# Patient Record
Sex: Male | Born: 1975 | Race: White | Hispanic: No | Marital: Married | Smoking: Never smoker
Health system: Southern US, Community
[De-identification: ages and names within clinical notes are randomized; demographics above are authoritative.]

## PROBLEM LIST (undated history)

## (undated) HISTORY — PX: UPPER GI ENDOSCOPY: SHX6162

## (undated) HISTORY — PX: DENTAL SURGERY: SHX609

---

## 1998-11-30 ENCOUNTER — Ambulatory Visit (HOSPITAL_BASED_OUTPATIENT_CLINIC_OR_DEPARTMENT_OTHER): Admission: RE | Admit: 1998-11-30 | Discharge: 1998-11-30 | Payer: Self-pay | Admitting: Otolaryngology

## 2009-12-08 ENCOUNTER — Emergency Department (HOSPITAL_BASED_OUTPATIENT_CLINIC_OR_DEPARTMENT_OTHER): Admission: EM | Admit: 2009-12-08 | Discharge: 2009-12-08 | Payer: Self-pay | Admitting: Emergency Medicine

## 2009-12-09 ENCOUNTER — Emergency Department (HOSPITAL_BASED_OUTPATIENT_CLINIC_OR_DEPARTMENT_OTHER): Admission: EM | Admit: 2009-12-09 | Discharge: 2009-12-09 | Payer: Self-pay | Admitting: Emergency Medicine

## 2011-09-14 ENCOUNTER — Ambulatory Visit: Payer: Self-pay

## 2013-06-05 ENCOUNTER — Emergency Department (HOSPITAL_COMMUNITY): Payer: BC Managed Care – PPO

## 2013-06-05 ENCOUNTER — Encounter (HOSPITAL_COMMUNITY): Payer: Self-pay

## 2013-06-05 ENCOUNTER — Emergency Department (HOSPITAL_COMMUNITY)
Admission: EM | Admit: 2013-06-05 | Discharge: 2013-06-05 | Disposition: A | Discharge status: Home / Self Care | Payer: BC Managed Care – PPO | Attending: Emergency Medicine | Admitting: Emergency Medicine

## 2013-06-05 DIAGNOSIS — R1013 Epigastric pain: Secondary | ICD-10-CM | POA: Insufficient documentation

## 2013-06-05 DIAGNOSIS — R109 Unspecified abdominal pain: Secondary | ICD-10-CM

## 2013-06-05 LAB — LIPASE, BLOOD: Lipase: 44 U/L (ref 11–59)

## 2013-06-05 LAB — COMPREHENSIVE METABOLIC PANEL
ALT: 28 U/L (ref 0–53)
AST: 25 U/L (ref 0–37)
Albumin: 4.3 g/dL (ref 3.5–5.2)
Alkaline Phosphatase: 50 U/L (ref 39–117)
Potassium: 3.9 mEq/L (ref 3.5–5.1)
Sodium: 142 mEq/L (ref 135–145)
Total Protein: 7.4 g/dL (ref 6.0–8.3)

## 2013-06-05 LAB — CBC
MCHC: 35.4 g/dL (ref 30.0–36.0)
Platelets: 174 10*3/uL (ref 150–400)
RDW: 13.2 % (ref 11.5–15.5)
WBC: 6 10*3/uL (ref 4.0–10.5)

## 2013-06-05 NOTE — ED Notes (Signed)
Pt escorted to discharge window. Pt verbalized understanding discharge instructions. In no acute distress.  

## 2013-06-05 NOTE — ED Provider Notes (Signed)
TIME SEEN: 10:30 AM  CHIEF COMPLAINT: Abdominal pain  HPI: Patient is a 37 year old male with no significant past medical history who presents the emergency department for epigastric abdominal pain that has been present intermittently since his endoscopy on Wednesday, 4 days ago. He reports that after he eats he'll have 1-2 minutes of sharp abdominal pain in his epigastric region that is mild to moderate in nature without radiation. It will resolve spontaneously. No known alleviating factors. He has never had similar symptoms. Denies any chest pain or shortness of breath. No vomiting or diarrhea. No bloody stool or melena. No fever. Patient called his gastroenterologist instructed him to come the emergency department for evaluation.  ROS: See HPI Constitutional: no fever  Eyes: no drainage  ENT: no runny nose   Cardiovascular:  no chest pain  Resp: no SOB  GI: no vomiting GU: no dysuria Integumentary: no rash  Allergy: no hives  Musculoskeletal: no leg swelling  Neurological: no slurred speech ROS otherwise negative  PAST MEDICAL HISTORY/PAST SURGICAL HISTORY:  History reviewed. No pertinent past medical history.  MEDICATIONS:  Prior to Admission medications   Not on File    ALLERGIES:  Allergies not on file  SOCIAL HISTORY:  History  Substance Use Topics  . Smoking status: Never Smoker   . Smokeless tobacco: Never Used  . Alcohol Use: Yes     Comment: every other day/beer    FAMILY HISTORY: History reviewed. No pertinent family history.  EXAM: BP 155/100  Pulse 98  Temp(Src) 98.2 F (36.8 C) (Oral)  Resp 17  Ht 6' (1.829 m)  Wt 145 lb (65.772 kg)  BMI 19.66 kg/m2  SpO2 100% CONSTITUTIONAL: Alert and oriented and responds appropriately to questions. Well-appearing; well-nourished HEAD: Normocephalic EYES: Conjunctivae clear, PERRL ENT: normal nose; no rhinorrhea; moist mucous membranes; pharynx without lesions noted NECK: Supple, no meningismus, no LAD  CARD:  RRR; S1 and S2 appreciated; no murmurs, no clicks, no rubs, no gallops RESP: Normal chest excursion without splinting or tachypnea; breath sounds clear and equal bilaterally; no wheezes, no rhonchi, no rales,  ABD/GI: Normal bowel sounds; non-distended; soft, non-tender, no rebound, no guarding BACK:  The back appears normal and is non-tender to palpation, there is no CVA tenderness EXT: Normal ROM in all joints; non-tender to palpation; no edema; normal capillary refill; no cyanosis    SKIN: Normal color for age and race; warm NEURO: Moves all extremities equally PSYCH: The patient's mood and manner are appropriate. Grooming and personal hygiene are appropriate.  MEDICAL DECISION MAKING: Patient with 4 days of abdominal pain after eating after his endoscopy. His abdominal exam is completely benign. He is hemodynamically stable. He is currently asymptomatic. Will obtain abdominal labs, x-ray of his abdomen to rule out acute perforation although given his benign exam this is unlikely. Anticipate discharge home with outpatient followup with his gastroenterologist, Dr. Janna Arch.  ED PROGRESS: Labs and abdominal imaging unremarkable. Patient is still well-appearing, asymptomatic. Will discuss with his gastroenterologist, Dr. Rhetta Mura with [facility] for close outpatient followup.  Spoke with Dr. Marisa Hua who is on call for Dr. Rhetta Mura. He agrees that patient can go home and call the office tomorrow for followup visit. Given return precautions. Patient verbalizes understanding is comfortable plan.  Layla Maw Trenia Tennyson, DO 06/05/13 1155

## 2013-06-05 NOTE — ED Notes (Signed)
Patient reports that he had an upper endoscopy 4 days ago and since then he has had sharp abdominal pain in the epigastric area after swallowing food that he describes as sharp and stabbing. pataient called the GI physician and was instructed to come to the ED for further evaluation and treatment.

## 2013-06-05 NOTE — ED Notes (Signed)
He tells me that he underwent E.G.D. By a Kathryne Sharper gastroenterologist this past Wed. (4 days ago) during which he had esophageal dilitation and biopsies.  He is here today with c/o that "whenever I eat, when I first swallow, I get a 'stabbing' pain".  His skin is normal, warm and dry and he is breathing normally and is in no distress.

## 2014-05-27 IMAGING — CR DG ABDOMEN ACUTE W/ 1V CHEST
4 series · 4 of 4 positions shown · non-contrast
Comparison: None.

CLINICAL DATA: Abdominal pain after endoscopy

ACUTE ABDOMEN SERIES (ABDOMEN 2 VIEW & CHEST 1 VIEW)

[w chest pa]
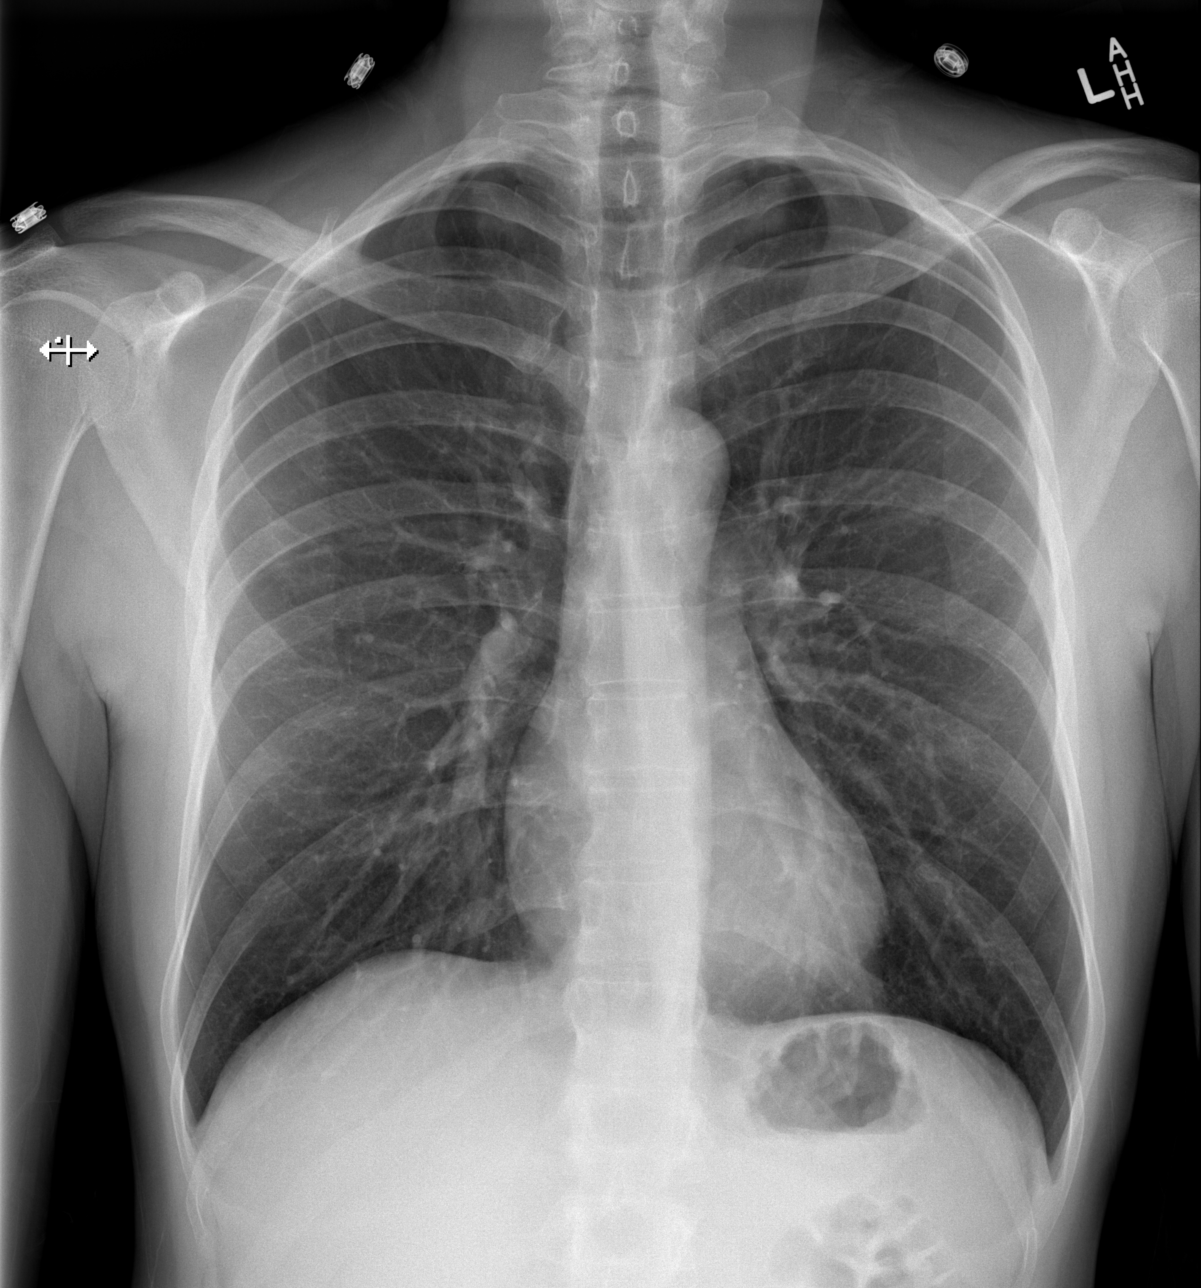

[w abdomen upright]
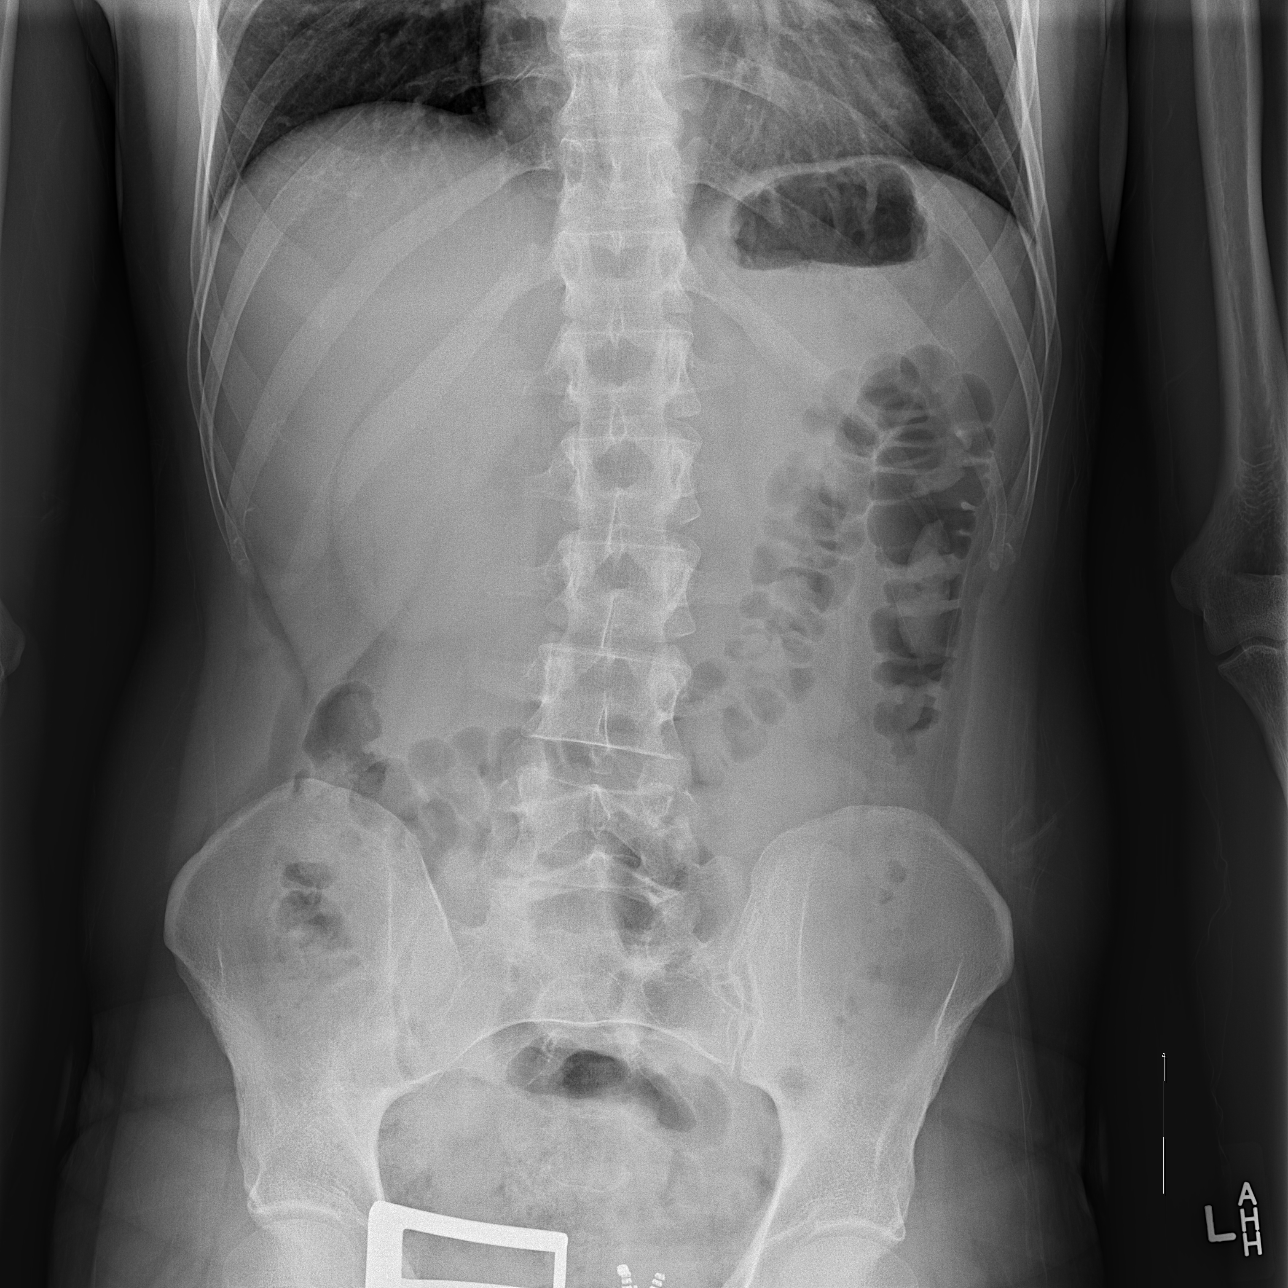

[t abdomen supine (1 of 2)]
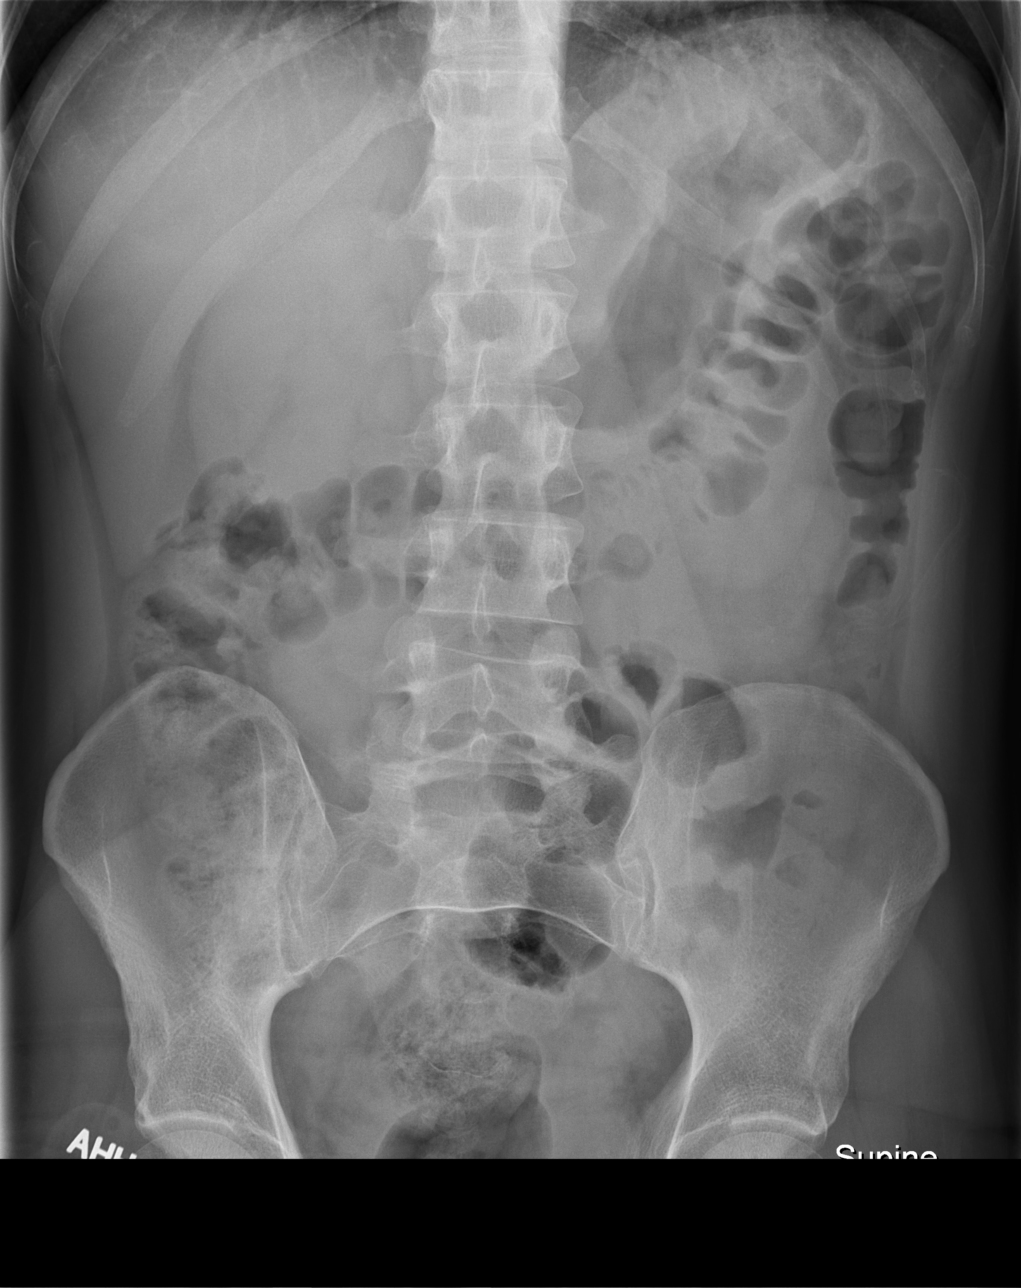

[t abdomen supine (2 of 2)]
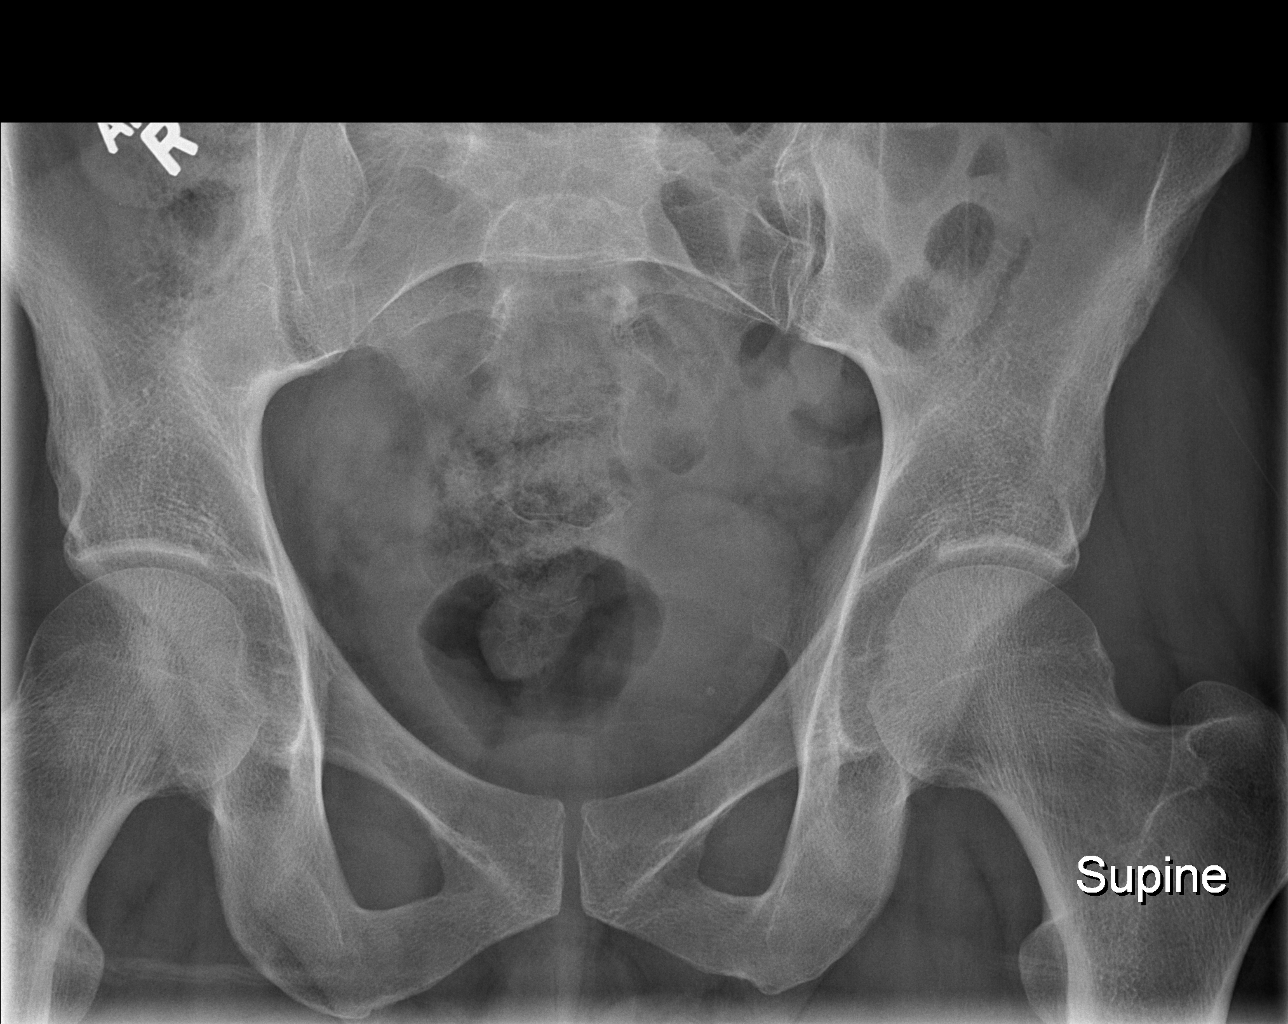

[4 of 4 positions shown; findings below may reference images not displayed]

FINDINGS: Heart size and vascular pattern are normal.  Lungs are
clear.  No free air.  No abnormally dilated loops of bowel.
IMPRESSION: Normal study

## 2014-11-01 ENCOUNTER — Ambulatory Visit: Payer: Self-pay | Admitting: Podiatry

## 2015-06-16 ENCOUNTER — Ambulatory Visit (INDEPENDENT_AMBULATORY_CARE_PROVIDER_SITE_OTHER): Payer: BC Managed Care – PPO | Admitting: Family Medicine

## 2015-06-16 VITALS — BP 122/72 | HR 69 | Temp 97.3°F | Resp 17 | Ht 71.5 in | Wt 140.0 lb

## 2015-06-16 DIAGNOSIS — H6123 Impacted cerumen, bilateral: Secondary | ICD-10-CM | POA: Diagnosis not present

## 2015-06-16 NOTE — Progress Notes (Signed)
   Subjective:  This chart was scribed for Norberto Sorenson, MD by [facility], medical scribe at [facility].The patient was seen in exam room 08 and the patient's care was started at 11:04 AM.   Patient ID: Philip Phillips, male    DOB: 1976-03-30, 39 y.o.   MRN: [id number] Chief Complaint  Patient presents with  . Cerumen Impaction   HPI  HPI Comments: Philip Phillips is a 39 y.o. male who presents to [facility] complaining of bilateral cerumen impaction with associated hearing loss, right worse than left. This is a chronic issue. Mineral oil and peroxide for minor relief.  No past medical history on file. No current outpatient prescriptions on file prior to visit.   No current facility-administered medications on file prior to visit.   No Known Allergies  Review of Systems  Constitutional: Negative for fever, chills, diaphoresis, activity change, appetite change and unexpected weight change.  HENT: Positive for hearing loss. Negative for congestion, drooling, ear discharge, ear pain, facial swelling, mouth sores, nosebleeds, postnasal drip, rhinorrhea, sinus pressure, sneezing, sore throat, tinnitus, trouble swallowing and voice change.   Eyes: Negative for pain.      Objective:  BP 122/72 mmHg  Pulse 69  Temp(Src) 97.3 F (36.3 C) (Oral)  Resp 17  Ht 5' 11.5" (1.816 m)  Wt 140 lb (63.504 kg)  BMI 19.26 kg/m2  SpO2 97% Physical Exam  Constitutional: He is oriented to person, place, and time. He appears well-developed and well-nourished. No distress.  HENT:  Head: Normocephalic and atraumatic.  Bilateral cerumen impaction right worse than left.  Eyes: Pupils are equal, round, and reactive to light.  Neck: Normal range of motion.  Cardiovascular: Normal rate and regular rhythm.   Pulmonary/Chest: Effort normal. No respiratory distress.  Musculoskeletal: Normal range of motion.  Neurological: He is alert and oriented to person, place, and time.    Skin: Skin is warm and dry.  Psychiatric: He has a normal mood and affect. His behavior is normal.  Nursing note and vitals reviewed.      Assessment & Plan:   1. Cerumen impaction, bilateral   CMA removed by lavage  I personally performed the services described in this documentation, which was scribed in my presence. The recorded information has been reviewed and considered, and addended by me as needed.  Norberto Sorenson, MD MPH    By signing my name below, I, Nadim Abuhashem, attest that this documentation has been prepared under the direction and in the presence of Norberto Sorenson, MD.  Electronically Signed: Conchita Paris, medical scribe. 06/16/2015, 11:06 AM.

## 2015-06-21 ENCOUNTER — Ambulatory Visit (INDEPENDENT_AMBULATORY_CARE_PROVIDER_SITE_OTHER): Payer: BC Managed Care – PPO | Admitting: Family Medicine

## 2015-06-21 VITALS — BP 132/82 | HR 73 | Temp 97.6°F | Resp 16 | Ht 71.5 in | Wt 141.0 lb

## 2015-06-21 DIAGNOSIS — J209 Acute bronchitis, unspecified: Secondary | ICD-10-CM | POA: Diagnosis not present

## 2015-06-21 DIAGNOSIS — J029 Acute pharyngitis, unspecified: Secondary | ICD-10-CM | POA: Diagnosis not present

## 2015-06-21 MED ORDER — AZITHROMYCIN 250 MG PO TABS: ORAL_TABLET | ORAL | Status: AC

## 2015-06-21 NOTE — Progress Notes (Signed)
° °  Subjective:    Patient ID: Philip Phillips, male    DOB: [birth year], 39 y.o.   MRN: [id number] This chart was scribed for Philip Sidle, MD by Littie Deeds, Medical Scribe. This patient was seen in Room 3 and the patient's care was started at 9:38 AM.   HPI HPI Comments: Eliazer Hemphill is a 39 y.o. male who presents to the [facility] complaining of gradual onset sore throat that started 3-4 days ago. Patient also reports having associated coughing. He had a coughing fit yesterday while at work. He notes that his wife has been ill with similar symptoms and his son has also been dealing with sinusitis. He has been able to sleep fine. Patient denies difficulty swallowing. He also denies smoking.  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  Review of Systems  HENT: Positive for sore throat.   Respiratory: Positive for cough.        Objective:   Physical Exam CONSTITUTIONAL: Well developed/well nourished HEAD: Normocephalic/atraumatic EYES: EOM/PERRL ENMT: Mucous membranes moist, posterior pharynx is red with no exudates NECK: supple no meningeal signs SPINE: entire spine nontender CV: S1/S2 noted, no murmurs/rubs/gallops noted LUNGS: Lungs are clear to auscultation bilaterally with exception of a few rhonchi bilaterally, no apparent distress ABDOMEN: soft, nontender, no rebound or guarding GU: no cva tenderness NEURO: Pt is awake/alert, moves all extremitiesx4 EXTREMITIES: pulses normal, full ROM SKIN: warm, color normal PSYCH: no abnormalities of mood noted        Assessment & Plan:   By signing my name below, I, Littie Deeds, attest that this documentation has been prepared under the direction and in the presence of Philip Sidle, MD.  Electronically Signed: Littie Deeds, Medical Scribe. 06/21/2015. 9:37 AM.  This chart was scribed in my presence and reviewed by me personally.    ICD-9-CM ICD-10-CM   1. Acute  bronchitis, unspecified organism 466.0 J20.9 azithromycin (ZITHROMAX) 250 MG tablet  2. Acute pharyngitis, unspecified pharyngitis type 462 J02.9 azithromycin (ZITHROMAX) 250 MG tablet     Signed, Philip Sidle, MD

## 2015-06-21 NOTE — Patient Instructions (Signed)

## 2015-08-21 ENCOUNTER — Ambulatory Visit (INDEPENDENT_AMBULATORY_CARE_PROVIDER_SITE_OTHER): Payer: BC Managed Care – PPO | Admitting: Physician Assistant

## 2015-08-21 VITALS — BP 134/74 | HR 95 | Temp 98.3°F | Resp 17 | Ht 71.5 in | Wt 145.0 lb

## 2015-08-21 DIAGNOSIS — R070 Pain in throat: Secondary | ICD-10-CM

## 2015-08-21 DIAGNOSIS — J069 Acute upper respiratory infection, unspecified: Secondary | ICD-10-CM

## 2015-08-21 LAB — POCT RAPID STREP A (OFFICE): Rapid Strep A Screen: NEGATIVE

## 2015-08-21 MED ORDER — IPRATROPIUM BROMIDE 0.03 % NA SOLN: 2.0000 | Freq: Two times a day (BID) | NASAL | Status: AC

## 2015-08-21 NOTE — Patient Instructions (Addendum)
Please hydrate well with 64 oz of water daily. Take the mucinex 1200mg  every 12 hours. Use Delsym for cough.   If you do not feel any better within the next 6 days, let me know, and we can discuss, or issue you an antibiotic.   Upper Respiratory Infection, Adult Most upper respiratory infections (URIs) are a viral infection of the air passages leading to the lungs. A URI affects the nose, throat, and upper air passages. The most common type of URI is nasopharyngitis and is typically referred to as "the common cold." URIs run their course and usually go away on their own. Most of the time, a URI does not require medical attention, but sometimes a bacterial infection in the upper airways can follow a viral infection. This is called a secondary infection. Sinus and middle ear infections are common types of secondary upper respiratory infections. Bacterial pneumonia can also complicate a URI. A URI can worsen asthma and chronic obstructive pulmonary disease (COPD). Sometimes, these complications can require emergency medical care and may be life threatening.  CAUSES Almost all URIs are caused by viruses. A virus is a type of germ and can spread from one person to another.  RISKS FACTORS You may be at risk for a URI if:   You smoke.   You have chronic heart or lung disease.  You have a weakened defense (immune) system.   You are very young or very old.   You have nasal allergies or asthma.  You work in crowded or poorly ventilated areas.  You work in health care facilities or schools. SIGNS AND SYMPTOMS  Symptoms typically develop 2-3 days after you come in contact with a cold virus. Most viral URIs last 7-10 days. However, viral URIs from the influenza virus (flu virus) can last 14-18 days and are typically more severe. Symptoms may include:   Runny or stuffy (congested) nose.   Sneezing.   Cough.   Sore throat.   Headache.   Fatigue.   Fever.   Loss of appetite.    Pain in your forehead, behind your eyes, and over your cheekbones (sinus pain).  Muscle aches.  DIAGNOSIS  Your health care provider may diagnose a URI by:  Physical exam.  Tests to check that your symptoms are not due to another condition such as:  Strep throat.  Sinusitis.  Pneumonia.  Asthma. TREATMENT  A URI goes away on its own with time. It cannot be cured with medicines, but medicines may be prescribed or recommended to relieve symptoms. Medicines may help:  Reduce your fever.  Reduce your cough.  Relieve nasal congestion. HOME CARE INSTRUCTIONS   Take medicines only as directed by your health care provider.   Gargle warm saltwater or take cough drops to comfort your throat as directed by your health care provider.  Use a warm mist humidifier or inhale steam from a shower to increase air moisture. This may make it easier to breathe.  Drink enough fluid to keep your urine clear or pale yellow.   Eat soups and other clear broths and maintain good nutrition.   Rest as needed.   Return to work when your temperature has returned to normal or as your health care provider advises. You may need to stay home longer to avoid infecting others. You can also use a face mask and careful hand washing to prevent spread of the virus.  Increase the usage of your inhaler if you have asthma.   Do not use  any tobacco products, including cigarettes, chewing tobacco, or electronic cigarettes. If you need help quitting, ask your health care provider. PREVENTION  The best way to protect yourself from getting a cold is to practice good hygiene.   Avoid oral or hand contact with people with cold symptoms.   Wash your hands often if contact occurs.  There is no clear evidence that vitamin C, vitamin E, echinacea, or exercise reduces the chance of developing a cold. However, it is always recommended to get plenty of rest, exercise, and practice good nutrition.  SEEK MEDICAL  CARE IF:   You are getting worse rather than better.   Your symptoms are not controlled by medicine.   You have chills.  You have worsening shortness of breath.  You have brown or red mucus.  You have yellow or brown nasal discharge.  You have pain in your face, especially when you bend forward.  You have a fever.  You have swollen neck glands.  You have pain while swallowing.  You have white areas in the back of your throat. SEEK IMMEDIATE MEDICAL CARE IF:   You have severe or persistent:  Headache.  Ear pain.  Sinus pain.  Chest pain.  You have chronic lung disease and any of the following:  Wheezing.  Prolonged cough.  Coughing up blood.  A change in your usual mucus.  You have a stiff neck.  You have changes in your:  Vision.  Hearing.  Thinking.  Mood. MAKE SURE YOU:   Understand these instructions.  Will watch your condition.  Will get help right away if you are not doing well or get worse.   This information is not intended to replace advice given to you by your health care provider. Make sure you discuss any questions you have with your health care provider.   Document Released: 03/04/2001 Document Revised: 01/23/2015 Document Reviewed: 12/14/2013 Elsevier Interactive Patient Education [employer]

## 2015-08-21 NOTE — Progress Notes (Signed)
[facility] [address], [place] [address] [phone]  Date:  08/21/2015   Name:  Philip CastleWilliam Phillips   DOB:  08/25/1976   MRN:  [id number]  PCP:  [facility]    History of Present Illness:  Philip Phillips is a 39 y.o. male patient who [facility] for chief complaint of sore throat.  Started yesterday, where throat feels painful and scratchy.  No nasal congestion, and a slight cough.  No fever, or ear discomfort.  He has not taken anything for it.  Several co-workers with colds.  Cough is not keeping him up at night.  No GI symptoms.  No sob or dyspnea.    There are no active problems to display for this patient.   History reviewed. No pertinent past medical history.  Past Surgical History  Procedure Laterality Date  . Upper gi endoscopy    . Dental surgery      Social History  Substance Use Topics  . Smoking status: Never Smoker   . Smokeless tobacco: Never Used  . Alcohol Use: Yes     Comment: every other day/beer    History reviewed. No pertinent family history.  No Known Allergies  Medication list has been reviewed and updated.  Current Outpatient Prescriptions on File Prior to Visit  Medication Sig Dispense Refill  . azithromycin (ZITHROMAX) 250 MG tablet Take 2 tabs PO x 1 dose, then 1 tab PO QD x 4 days (Patient not taking: Reported on 08/21/2015) 6 tablet 0   No current facility-administered medications on file prior to visit.    ROS ROS otherwise unremarkable unless listed above.   Physical Examination: BP 134/74 mmHg  Pulse 95  Temp(Src) 98.3 F (36.8 C) (Oral)  Resp 17  Ht 5' 11.5" (1.816 m)  Wt 145 lb (65.772 kg)  BMI 19.94 kg/m2  SpO2 98% Ideal Body Weight: Weight in (lb) to have BMI = 25: 181.4  Physical Exam  HENT:  Right Ear: Tympanic membrane, external ear and ear canal normal.  Left Ear: Tympanic membrane, external ear and ear canal normal.  Nose: Rhinorrhea (mucopurulent) present. Right sinus  exhibits no maxillary sinus tenderness and no frontal sinus tenderness. Left sinus exhibits no maxillary sinus tenderness and no frontal sinus tenderness.  Mouth/Throat: Posterior oropharyngeal erythema (mild) present. No oropharyngeal exudate or posterior oropharyngeal edema.  Pharynx with mucopurulent fluid with reflection, consistent with post-nasal drip  Lymphadenopathy:    He has no cervical adenopathy.       Right cervical: No superficial cervical adenopathy present.    Assessment and Plan: Philip CastleWilliam Phillips is a 39 y.o. male who is here today for UR symptoms for 1 day.  Appears URI of viral etiology.  I have advised to take mucinex, and ipratropium nasal spray.  Advised heavy hydration.  If he continues with these symptoms after 6 days, it is fine to start him on an Augmentin 875-125 mg bid for 10 days.  URI (upper respiratory infection) - Plan: ipratropium (ATROVENT) 0.03 % nasal spray  Throat pain - Plan: POCT rapid strep A, Culture, Group A Strep   Trena PlattStephanie English, PA-C [facility] 08/21/2015 11:37 AM

## 2015-08-23 LAB — CULTURE, GROUP A STREP: Organism ID, Bacteria: NORMAL
